# Patient Record
Sex: Female | Born: 1990 | Race: Black or African American | Hispanic: No | Marital: Single
Health system: Southern US, Community
[De-identification: ages and names within clinical notes are randomized; demographics above are authoritative.]

---
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
## 2017-11-23 ENCOUNTER — Emergency Department (HOSPITAL_COMMUNITY)
Admission: EM | Admit: 2017-11-23 | Discharge: 2017-11-23 | Disposition: A | Discharge status: Home / Self Care | Payer: Managed Care, Other (non HMO) | Attending: Emergency Medicine | Admitting: Emergency Medicine

## 2017-11-23 ENCOUNTER — Encounter: Payer: Self-pay | Admitting: Emergency Medicine

## 2017-11-23 DIAGNOSIS — R0602 Shortness of breath: Secondary | ICD-10-CM | POA: Diagnosis present

## 2017-11-23 DIAGNOSIS — R062 Wheezing: Secondary | ICD-10-CM | POA: Diagnosis not present

## 2017-11-23 MED ORDER — ALBUTEROL SULFATE HFA 108 (90 BASE) MCG/ACT IN AERS: 1.0000 | INHALATION_SPRAY | Freq: Four times a day (QID) | RESPIRATORY_TRACT | 0 refills | Status: AC | PRN

## 2017-11-23 MED ORDER — ALBUTEROL SULFATE (2.5 MG/3ML) 0.083% IN NEBU
INHALATION_SOLUTION | RESPIRATORY_TRACT | Status: AC
  Filled 2017-11-23: qty 3

## 2017-11-23 MED ORDER — ALBUTEROL SULFATE (2.5 MG/3ML) 0.083% IN NEBU
5.0000 mg | INHALATION_SOLUTION | Freq: Once | RESPIRATORY_TRACT | Status: AC
  Administered 2017-11-23: 5 mg via RESPIRATORY_TRACT

## 2017-11-23 MED ORDER — ALBUTEROL SULFATE (2.5 MG/3ML) 0.083% IN NEBU: 2.5000 mg | INHALATION_SOLUTION | Freq: Once | RESPIRATORY_TRACT | Status: DC

## 2017-11-23 MED FILL — VENTOLIN HFA 90 MCG INHALER: 108 (90 BAS | 25 days supply | Qty: 18 | Fill #0

## 2017-11-23 NOTE — Discharge Instructions (Addendum)
Have provided a inhaler prescription for your shortness of breath.  Please make sure to follow-up with her primary care physician as they need to further manage your asthma during your pregnancy.  If your symptoms worsen or you experience any worsening shortness of breath, chest pain, leg swelling please return to the ED for reevaluation.

## 2017-11-23 NOTE — ED Notes (Signed)
FHT 146

## 2017-11-23 NOTE — ED Notes (Signed)
Patient verbalizes understanding of discharge instructions. Opportunity for questioning and answers were provided. Armband removed by staff, pt discharged from ED ambulatory.   

## 2017-11-23 NOTE — ED Triage Notes (Signed)
Pt arrives to ED after waking up with wheezing and tightness in chest. Pt was wheezing in left lung fields. Pt states she does not have an inhaler [place]. Pt has dry cough. Pt is 7 months pregnant.

## 2017-11-23 NOTE — ED Provider Notes (Addendum)
[facility] EMERGENCY DEPARTMENT Provider Note   CSN: [id number] Arrival date & time: 11/23/17  0856     History   Chief Complaint Chief Complaint  Patient presents with  . Asthma    HPI Monica Gill is a 27 y.o. female.  27 y.o female G1P0 currently 7 months pregnant with no previous history of complications, under prenatal care of OB in [place].Presents to the ED with a chief complaint of shortness of breath since last night. She states she does not have her inhaler as she currently does not live in the state and has relocated to [place]. Patient reports her shortness of breath for worst this morning.She is not seen by a regular PCP as she states "when I get short of breath I normally come to the ED, they give me a breathing treatment and I go up on my way". She reports the breathing is worse with deep inspiration. She is a former smoker but denies smoking during her pregnancy.She denies any abdominal pain, vaginal bleeding, decrease on fetal movement or other complaints.      No past medical history on file.  There are no active problems to display for this patient.    OB History    Gravida  1   Para      Term      Preterm      AB      Living        SAB      TAB      Ectopic      Multiple      Live Births               Home Medications    Prior to Admission medications   Not on File    Family History No family history on file.  Social History Social History   Tobacco Use  . Smoking status: Not on file  Substance Use Topics  . Alcohol use: Not on file  . Drug use: Not on file     Allergies   Patient has no known allergies.   Review of Systems Review of Systems  Constitutional: Negative for chills and fever.  HENT: Negative for ear pain and sore throat.   Eyes: Negative for pain and visual disturbance.  Respiratory: Positive for cough (dry cough), chest tightness, shortness of breath and wheezing.     Cardiovascular: Negative for chest pain and palpitations.  Gastrointestinal: Negative for abdominal pain and vomiting.  Genitourinary: Negative for dysuria and hematuria.  Musculoskeletal: Negative for arthralgias and back pain.  Skin: Negative for color change and rash.  Neurological: Negative for seizures and syncope.  All other systems reviewed and are negative.    Physical Exam Updated Vital Signs BP (!) 116/97 (BP Location: Right Arm)   Pulse 90   Temp 97.8 F (36.6 C) (Oral)   Resp 16   LMP 08/23/2017   SpO2 99%   Physical Exam  Constitutional: She is oriented to person, place, and time. She appears well-developed. No distress.  Patient nontoxic, nondistressed, currently laying on stretcher in hallway with her eyes closed in a blanket.  HENT:  Head: Normocephalic and atraumatic.  Eyes: Pupils are equal, round, and reactive to light.  Neck: Normal range of motion. Neck supple.  Pulmonary/Chest: Effort normal. No accessory muscle usage. No tachypnea. No respiratory distress. She has wheezes in the left upper field.  Wheezing on Left Upper Lung field.  Abdominal: Soft. There is no  tenderness.  Musculoskeletal:       Right lower leg: She exhibits no tenderness, no swelling and no edema.       Left lower leg: She exhibits no tenderness, no swelling and no edema.  No calf tenderness palpable on exam, no edema, swelling to her legs bilaterally.  Neurological: She is alert and oriented to person, place, and time.  Skin: Skin is warm and dry. She is not diaphoretic.  Nursing note and vitals reviewed.    ED Treatments / Results  Labs (all labs ordered are listed, but only abnormal results are displayed) Labs Reviewed - No data to display  EKG None  Radiology No results found.  Procedures Procedures (including critical care time)  Medications Ordered in ED Medications  albuterol (PROVENTIL) (2.5 MG/3ML) 0.083% nebulizer solution 2.5 mg (has no administration in  time range)  albuterol (PROVENTIL) (2.5 MG/3ML) 0.083% nebulizer solution 5 mg (5 mg Nebulization Given 11/23/17 0908)     Initial Impression / Assessment and Plan / ED Course  I have reviewed the triage vital signs and the nursing notes.  Pertinent labs & imaging results that were available during my care of the patient were reviewed by me and considered in my medical decision making (see chart for details).   Patient presents with wheezing she is a recurrent complaint for her.  She has a previous history of asthma patient states the wheezing and shortness of breath got worse this morning as she was coming in to see us.  She currently lives in [place] and states she does not have an inhaler or prescription for inhaler at this time.    Patient received albuterol treatment in triage and her wheezing has improved she has mild wheezing on her left upper lung field.  I have tried given patient a second breathing treatment but she reports she does not need a breathing treatment as she does not feel as short of breath anymore but would like a prescription for an albuterol inhaler to take home with her.  I will provide this albuterol option for her.   Patient is not tachycardic upon examination, she has no swelling in her legs, I believe this is less likely DVT or PE as patient does have a history of Asthma. She denies any recent hospitalizations for her asthma. She denies any other complaints at this time.  Vitals have been stable during ED course, patient stable for discharge.  Return precautions provided.  Final Clinical Impressions(s) / ED Diagnoses   Final diagnoses:  Wheezing    ED Discharge Orders    None       Claude MangesSoto, Allyiah Gartner, Cordelia Poche-C 11/23/17 1137    Arby BarrettePfeiffer, Marcy, MD 11/25/17 0915    Claude MangesSoto, Bradan Congrove, PA-C 11/25/17 1121    [address], [place], PA-C 11/25/17 1123    Arby BarrettePfeiffer, Marcy, MD 11/25/17 1128
